# Patient Record
Sex: Female | Born: 1950 | Race: White | Hispanic: No | Marital: Single
Health system: Southern US, Community
[De-identification: ages and names within clinical notes are randomized; demographics above are authoritative.]

---

## 2010-05-16 ENCOUNTER — Emergency Department: Payer: Self-pay | Admitting: Emergency Medicine

## 2010-05-17 ENCOUNTER — Inpatient Hospital Stay: Payer: Self-pay | Admitting: Surgery

## 2010-06-29 ENCOUNTER — Ambulatory Visit: Payer: Self-pay | Admitting: Surgery

## 2010-07-01 ENCOUNTER — Ambulatory Visit: Payer: Self-pay | Admitting: Surgery

## 2010-07-05 LAB — PATHOLOGY REPORT

## 2012-01-03 ENCOUNTER — Ambulatory Visit: Payer: Self-pay | Admitting: Internal Medicine

## 2012-01-05 ENCOUNTER — Ambulatory Visit: Payer: Self-pay | Admitting: Internal Medicine

## 2012-03-05 ENCOUNTER — Ambulatory Visit: Payer: Self-pay | Admitting: Surgery

## 2012-03-06 ENCOUNTER — Ambulatory Visit: Payer: Self-pay | Admitting: Surgery

## 2012-03-10 LAB — BODY FLUID CULTURE

## 2012-03-28 ENCOUNTER — Ambulatory Visit: Payer: Self-pay | Admitting: Surgery

## 2012-03-28 LAB — CREATININE, SERUM
Creatinine: 1.33 mg/dL — ABNORMAL HIGH (ref 0.60–1.30)
EGFR (African American): 51 — ABNORMAL LOW

## 2012-04-15 ENCOUNTER — Ambulatory Visit: Payer: Self-pay | Admitting: Surgery

## 2012-04-15 LAB — CREATININE, SERUM
Creatinine: 1.32 mg/dL — ABNORMAL HIGH (ref 0.60–1.30)
EGFR (Non-African Amer.): 44 — ABNORMAL LOW

## 2012-05-06 ENCOUNTER — Ambulatory Visit: Payer: Self-pay | Admitting: Surgery

## 2012-05-06 LAB — CBC WITH DIFFERENTIAL/PLATELET
Basophil %: 0.5 %
Eosinophil #: 0.1 10*3/uL (ref 0.0–0.7)
HCT: 40.7 % (ref 35.0–47.0)
HGB: 12.6 g/dL (ref 12.0–16.0)
Lymphocyte #: 1.3 10*3/uL (ref 1.0–3.6)
Lymphocyte %: 12.9 %
MCH: 28.3 pg (ref 26.0–34.0)
MCHC: 31 g/dL — ABNORMAL LOW (ref 32.0–36.0)
MCV: 91 fL (ref 80–100)
Monocyte %: 6.5 %
Neutrophil #: 7.9 10*3/uL — ABNORMAL HIGH (ref 1.4–6.5)
RBC: 4.46 10*6/uL (ref 3.80–5.20)
RDW: 14.5 % (ref 11.5–14.5)
WBC: 10 10*3/uL (ref 3.6–11.0)

## 2012-05-06 LAB — BASIC METABOLIC PANEL
BUN: 24 mg/dL — ABNORMAL HIGH (ref 7–18)
Calcium, Total: 9.6 mg/dL (ref 8.5–10.1)
Chloride: 105 mmol/L (ref 98–107)
Co2: 27 mmol/L (ref 21–32)
EGFR (Non-African Amer.): 40 — ABNORMAL LOW
Potassium: 3.6 mmol/L (ref 3.5–5.1)
Sodium: 137 mmol/L (ref 136–145)

## 2012-05-06 LAB — HEPATIC FUNCTION PANEL A (ARMC): Bilirubin, Direct: <0.1 mg/dL (ref 0.00–0.20)

## 2012-05-06 LAB — PROTIME-INR: Prothrombin Time: 11.9 secs (ref 11.5–14.7)

## 2012-05-06 LAB — APTT: Activated PTT: 25.8 secs (ref 23.6–35.9)

## 2012-05-15 ENCOUNTER — Inpatient Hospital Stay: Payer: Self-pay | Admitting: Surgery

## 2012-05-15 LAB — CBC WITH DIFFERENTIAL/PLATELET
Basophil #: 0 10*3/uL (ref 0.0–0.1)
Eosinophil #: 0 10*3/uL (ref 0.0–0.7)
Eosinophil %: 0 %
HCT: 34.3 % — ABNORMAL LOW (ref 35.0–47.0)
HGB: 11 g/dL — ABNORMAL LOW (ref 12.0–16.0)
Lymphocyte %: 3.3 %
MCH: 29.6 pg (ref 26.0–34.0)
Monocyte #: 0.9 x10 3/mm (ref 0.2–0.9)
RBC: 3.73 10*6/uL — ABNORMAL LOW (ref 3.80–5.20)
RDW: 14.2 % (ref 11.5–14.5)
WBC: 23 10*3/uL — ABNORMAL HIGH (ref 3.6–11.0)

## 2012-05-16 LAB — CBC WITH DIFFERENTIAL/PLATELET
Basophil #: 0 10*3/uL (ref 0.0–0.1)
HCT: 32.5 % — ABNORMAL LOW (ref 35.0–47.0)
Lymphocyte %: 3.6 %
MCH: 30.1 pg (ref 26.0–34.0)
Monocyte %: 2.8 %
Neutrophil %: 93.6 %
Platelet: 229 10*3/uL (ref 150–440)
RDW: 14.2 % (ref 11.5–14.5)
WBC: 16.5 10*3/uL — ABNORMAL HIGH (ref 3.6–11.0)

## 2012-05-16 LAB — COMPREHENSIVE METABOLIC PANEL
Anion Gap: 9 (ref 7–16)
BUN: 19 mg/dL — ABNORMAL HIGH (ref 7–18)
Bilirubin,Total: 0.3 mg/dL (ref 0.2–1.0)
Calcium, Total: 7.7 mg/dL — ABNORMAL LOW (ref 8.5–10.1)
Co2: 22 mmol/L (ref 21–32)
EGFR (African American): 42 — ABNORMAL LOW
EGFR (Non-African Amer.): 36 — ABNORMAL LOW
Glucose: 161 mg/dL — ABNORMAL HIGH (ref 65–99)
SGPT (ALT): 24 U/L (ref 12–78)
Sodium: 142 mmol/L (ref 136–145)
Total Protein: 6.5 g/dL (ref 6.4–8.2)

## 2012-05-17 LAB — CBC WITH DIFFERENTIAL/PLATELET
Basophil %: 0 %
Eosinophil #: 0 10*3/uL (ref 0.0–0.7)
Eosinophil %: 0 %
HGB: 9.6 g/dL — ABNORMAL LOW (ref 12.0–16.0)
Lymphocyte #: 0.9 10*3/uL — ABNORMAL LOW (ref 1.0–3.6)
Lymphocyte %: 5 %
MCH: 30.4 pg (ref 26.0–34.0)
MCHC: 32.6 g/dL (ref 32.0–36.0)
Monocyte #: 1.2 x10 3/mm — ABNORMAL HIGH (ref 0.2–0.9)
Neutrophil %: 88.3 %
Platelet: 208 10*3/uL (ref 150–440)
RBC: 3.17 10*6/uL — ABNORMAL LOW (ref 3.80–5.20)
RDW: 14.1 % (ref 11.5–14.5)

## 2012-05-17 LAB — BASIC METABOLIC PANEL
Anion Gap: 5 — ABNORMAL LOW (ref 7–16)
BUN: 18 mg/dL (ref 7–18)
Chloride: 113 mmol/L — ABNORMAL HIGH (ref 98–107)
Co2: 25 mmol/L (ref 21–32)
Creatinine: 1.23 mg/dL (ref 0.60–1.30)
EGFR (African American): 56 — ABNORMAL LOW
EGFR (Non-African Amer.): 48 — ABNORMAL LOW
Glucose: 131 mg/dL — ABNORMAL HIGH (ref 65–99)
Osmolality: 289 (ref 275–301)
Sodium: 143 mmol/L (ref 136–145)

## 2012-05-21 LAB — PATHOLOGY REPORT

## 2013-01-06 ENCOUNTER — Ambulatory Visit: Payer: Self-pay | Admitting: Internal Medicine

## 2014-01-07 ENCOUNTER — Ambulatory Visit: Payer: Self-pay | Admitting: Internal Medicine

## 2014-03-29 IMAGING — CT CT PELVIS W/O CM
1 series · 14 of 32 positions shown, 18 images · non-contrast
Comparison: none

REASON FOR EXAM: Stat Call back [id number] Subcutaneous R hip Abscess
COMMENTS:

PROCEDURE:     KCT - KCT PELVIS STANDARD WO  - March 05, 2012  [DATE]
RESULT:     Comparison: None.
TECHNIQUE: Multiple axial images were obtained of the pelvis from the mid
lumbar spine through the pubic symphysis, without oral or intravenous
contrast.

[Series 7: pelvis wo · axial · 0.94mm/px · z∈[-464,-154]mm · 14 of 71 slices shown, 18 images]
[im 5/71  soft-tissue]
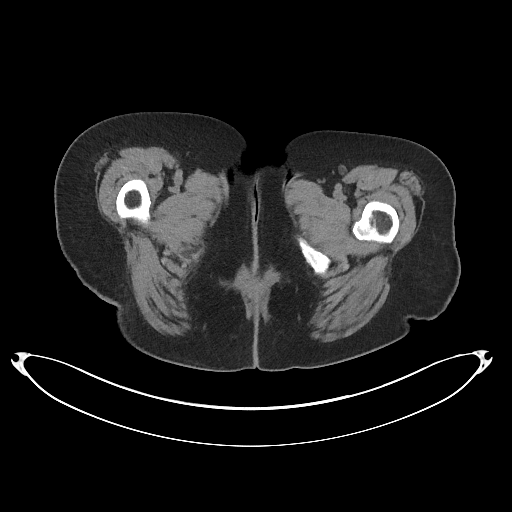
[im 5/71  bone]
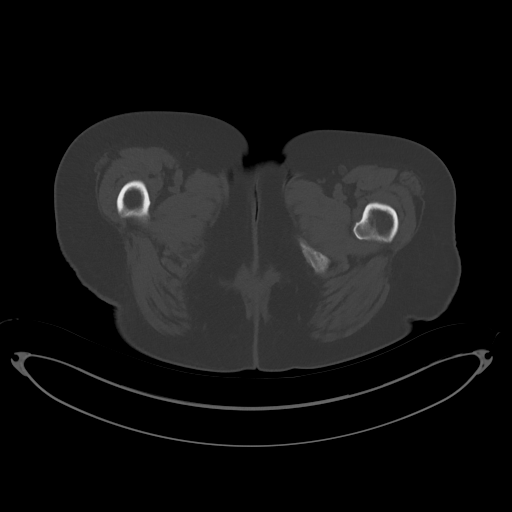
[im 10/71  soft-tissue]
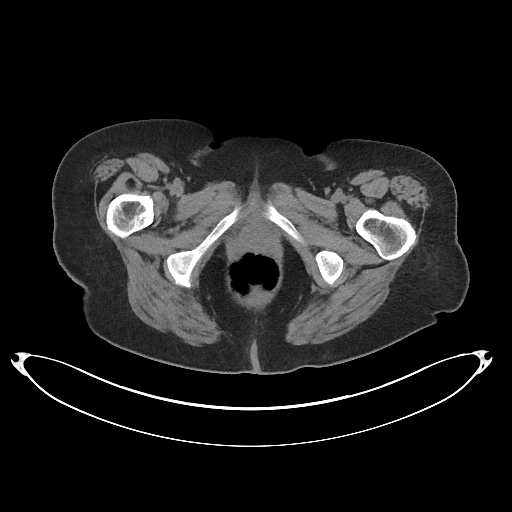
[im 16/71  soft-tissue]
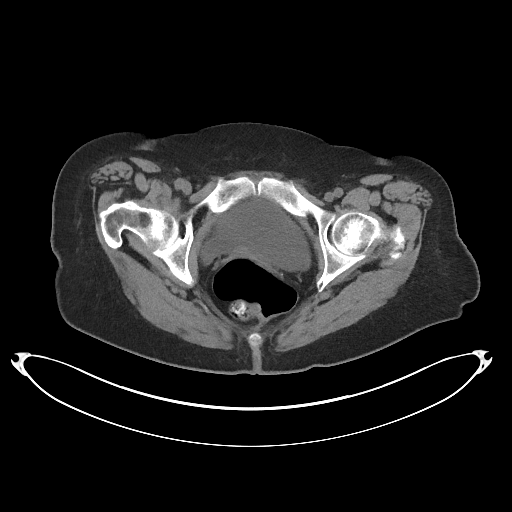
[im 21/71  soft-tissue]
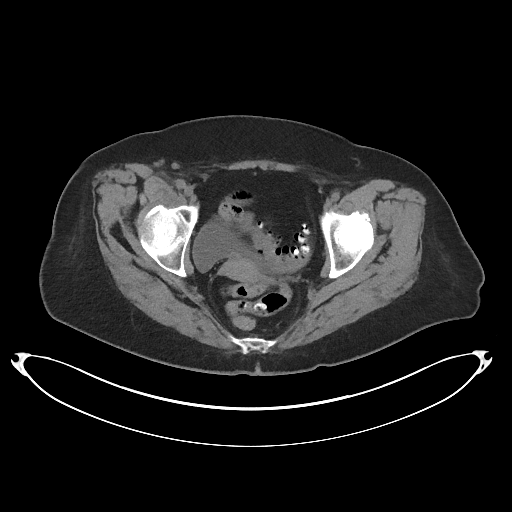
[im 28/71  soft-tissue]
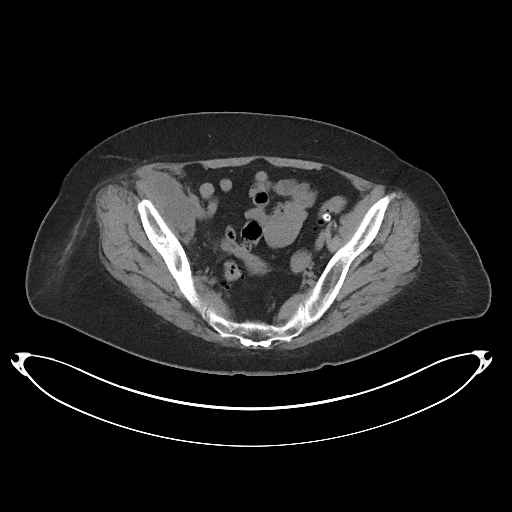
[im 32/71  soft-tissue]
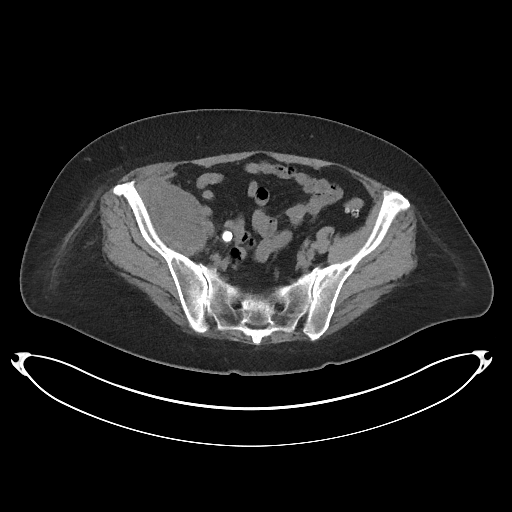
[im 39/71  soft-tissue]
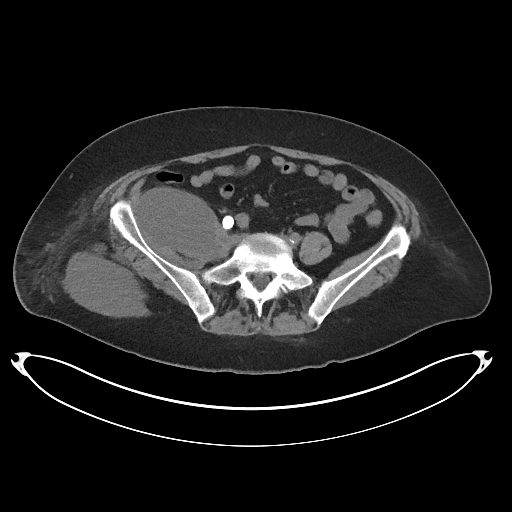
[im 43/71  soft-tissue]
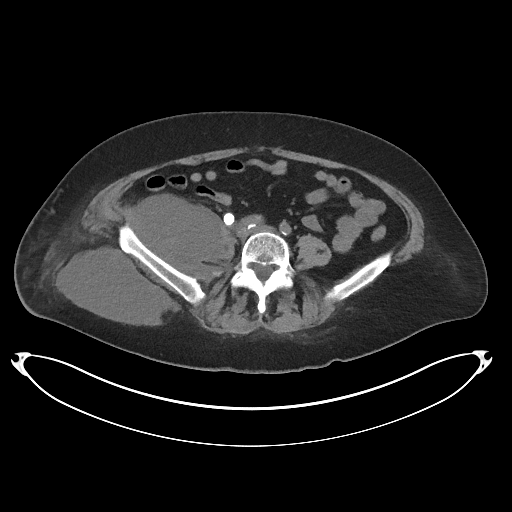
[im 50/71  soft-tissue]
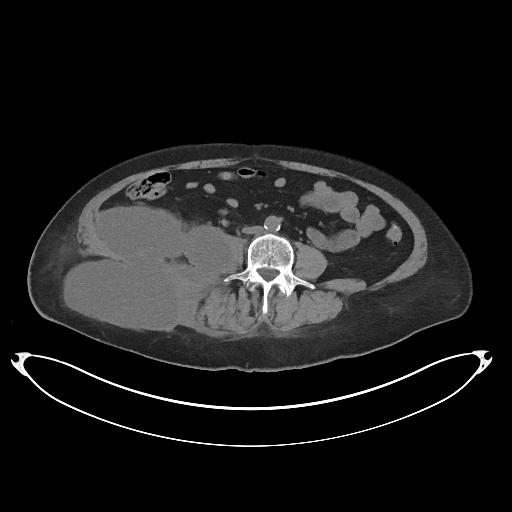
[im 50/71  bone]
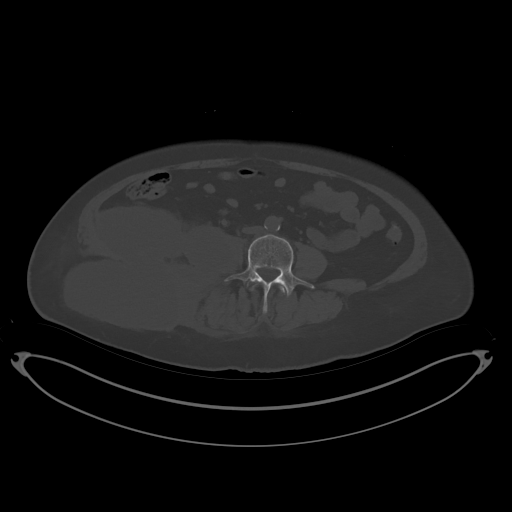
[im 55/71  soft-tissue]
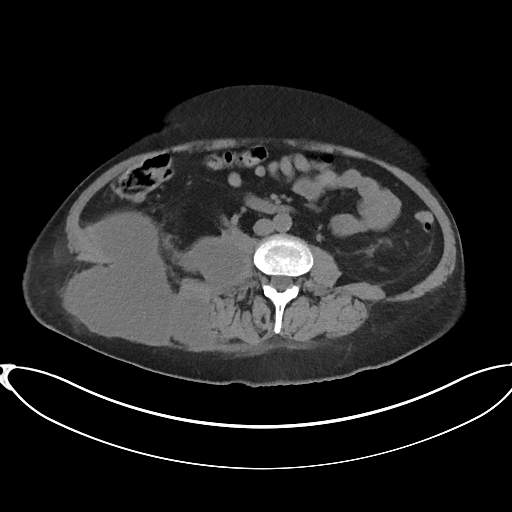
[im 61/71  soft-tissue]
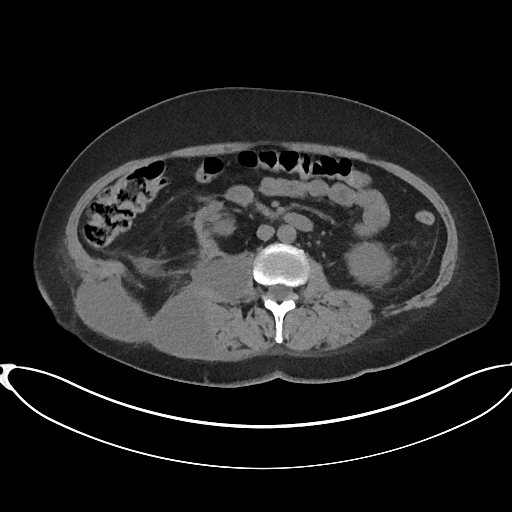
[im 61/71  lung]
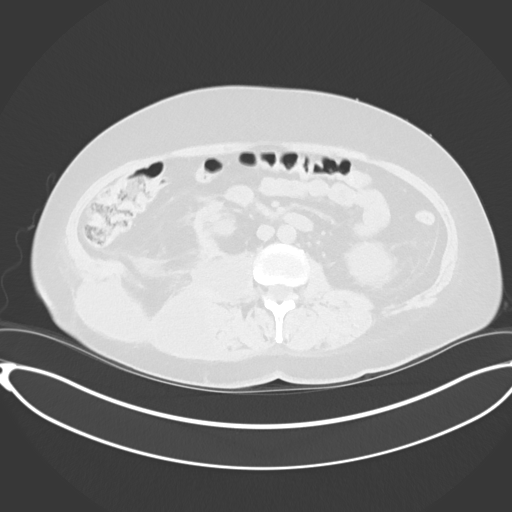
[im 64/71  lung]
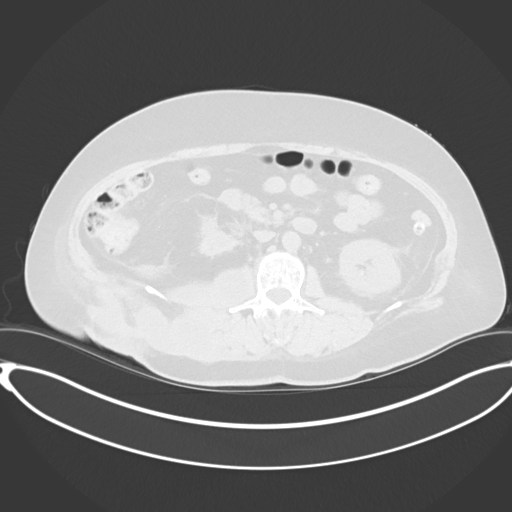
[im 66/71  soft-tissue]
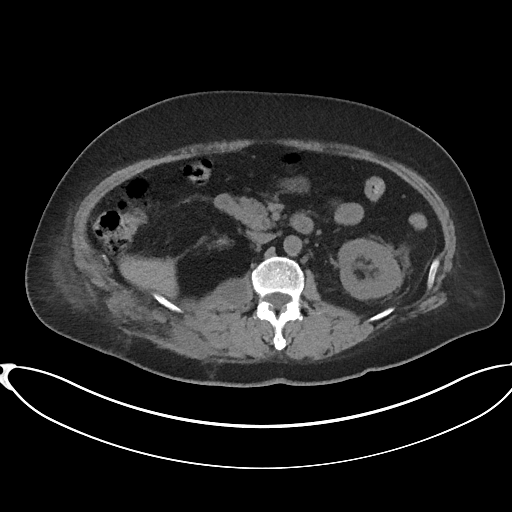
[im 66/71  lung]
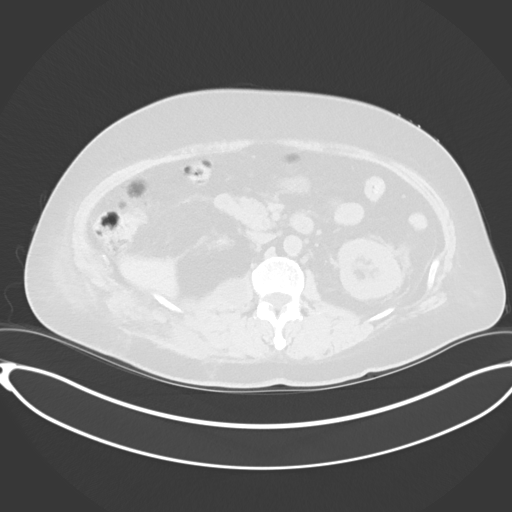
[im 68/71  lung]
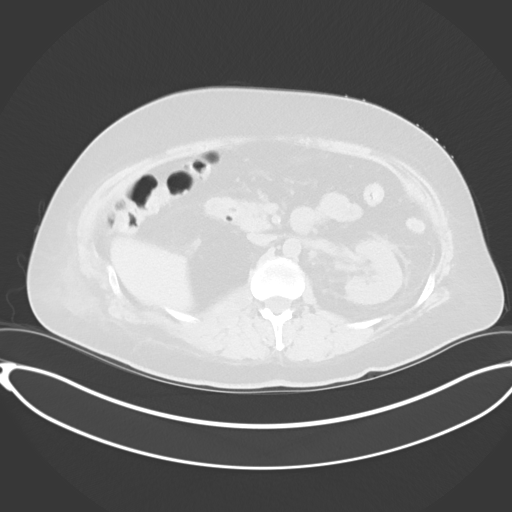

[14 of 32 positions shown; findings below may reference images not displayed]

FINDINGS: There is a large lobulated low density structure in the right side of the
pelvis which is seen extending from the posterior subcutaneous tissues into
the posterior aspect of the abdomen and pelvis as it is draped over the
iliac crest. The Hounsfield units measure 19. This could represent
complicated fluid. A solid mass cannot be excluded.  The collection extends
and involves the right iliopsoas muscle as well as the right erector spinae
musculature. It involves the right psoas muscle from the level of the lumbar
spine to the hip. This multilobulated collection measured approximately
x 11.2 cm. There is mild adjacent stranding, which is nonspecific. There is
a small of soft tissue density which extends from the atrophied right kidney
to the fluid collection in the right psoas muscle.

The right kidney is relatively atrophic. There is a large calculus filling
the right midureter which measures 11 mm and diameter and extends
approximately 8 cm in craniocaudal dimension. There is diverticulosis of the
sigmoid colon. The visualized small and large bowel are normal in caliber.
There is a 2 mm calculus in the left kidney.

No aggressive lytic or sclerotic osseous lesions are identified.
IMPRESSION: 1. Very large low density lobulated structure involving the subcutaneous fat
of the posterior pelvis as well as extending within the abdomen and pelvis
involving the psoas muscle as well as the erector spinae musculature of the
spine on the right. This may represent a complicated fluid collection such
as an abscess or proteinaceous collection. A solid mass cannot excluded by
this noncontrast study based on its density. There is a small area of soft
tissue density which extends from the involved psoas muscle to the atrophied
right kidney, and if this is an abscess, it may be renal in origin.
2. There is a large calculus filling the right ureter, with atrophy of the
right kidney.

This was called to Sandip Tiger at Dr. [REDACTED] at 4838 hours
03/12/2012.

## 2015-01-05 NOTE — Op Note (Signed)
PATIENT NAME:  Sabrina Boone, Sabrina Boone MR#:  [id number] DATE OF BIRTH:  [birth year]  DATE OF PROCEDURE:  05/15/2012  OPERATION PERFORMED: Hand-assisted laparoscopic radical right nephroureterectomy.   PREOPERATIVE DIAGNOSES: Recurrent Proteus psoas abscesses, chronic obstructed nonfunctioning right kidney, from huge distal ureteral stones.   SURGEON: Claude MangesWilliam F. Danyale Ridinger, MD  FIRST ASSISTANT: Dr. Ida Roguehristopher Lundquist  ANESTHESIA: General.   PROCEDURE IN DETAIL: The patient was placed supine on the Operating Room table and prepped and draped in the usual sterile fashion. A four-inch periumbilical midline incision was made and carried down through subcutaneous tissue and the linea alba and the peritoneum was entered carefully. A gel port was placed and a 15 mmHg CO2 pneumoperitoneum was created and a 12 mm trocar was placed in the right lower quadrant and a 5 mm trocar was placed in the right upper quadrant and subsequently during the procedure another 12 mm trocar was placed in the right upper quadrant near the gel port. Adhesions from the visceral surface of the gallbladder were taken down with the Harmonic scalpel and the adhesions to the visceral surface of the right lobe of the liver were taken down with the Harmonic scalpel and the right triangular ligament of the liver was partially divided and then the hepatic flexure of the colon was mobilized off of Gerota's fascia such that the entire right colon as well as the distal portion of the base of the mesentery was completely mobilized off of Gerota's fascia. This included the appendix. The patient was noted to have a very long calcified, very thick (2 cm diameter) distal ureter from the beginning of the true pelvis all the way the bladder. There was a small portion of distal ureter prior to the bladder that was soft. The proximal portion of this which was very densely scarred down was tightly adherent to the tissue surrounding the common iliac artery. This  therefore required ligation and division of the ovarian vessels fairly close to the ovary which was done with the Harmonic scalpel and then the ovary and fallopian tube were dissected off of the ureter all the way down to the area near where the fallopian tube joined the uterus. Tedious careful dissection ultimately freed this ureter off of the iliac vessels distally such that it was completely encircled and ultimately the distal ureter was divided with the echelon stapling device caring a gold load. An additional large Hemoclip or two were placed on a periurethral vessel following the division of the distal ureter. The radical nephrectomy was undertaken by staying outside of Gerota's fascia and dividing the very densely adherent scar tissue between the dorsal aspect of Gerota's fascia and the psoas muscle such that the kidney could be mobilized. A small Kocher maneuver was performed anteriorly and Gerota's fascia was entered just at the top of the kidney in order to maintain the right adrenal gland intact in the patient. As the inferior vena cava was exposed down the right lateral sidewall of the inferior vena cava a small vein was encountered and this was controlled with hemoclips. Initially, I felt that this was the ovarian vein but after the kidney had been removed I realize that this was the right renal vein. The entire kidney was mobilized onto what I thought was the renal pedicle and I had also circumferentially mobilized the proximal ureter and perirenal fat and then divided what I thought was the renal pedicle with the echelon stapling device utilizing a white vascular load. This then left the entire kidney and  ureter stuck to the common iliac artery and tedious dissection here with the Harmonic scalpel as well as the laparoscopic scissors allowed this ureter to be freed from the iliac artery. The specimen was removed and the entire operative field was irrigated with copious amounts of normal saline and  this was all suctioned clear. A lap sponge was then introduced to clean up small bleeding areas in the retroperitoneum where the psoas muscle was and along the inferior vena cava and then I realized that the previously placed hemoclip had actually been placed on a diminutive renal vein. I found the renal artery just dorsal and slightly inferior to that vein and although that artery was not bleeding it had been divided with the Harmonic scalpel and therefore I placed 3 or 4 additional large hemoclips on it. After hemostasis was assured the duodenum was replaced in its anatomic position as was the right colon covering the majority of the raw surface in the retroperitoneum and on psoas muscle and the right fallopian tube and ovary were placed right over top of where the ureter had been divided close to where it was going down towards the bladder. The two 12 mm ports were removed and the laparoscopic puncture closure device was used to close the fascia in both of these sites with interrupted 0 Vicryl and then the peritoneum was desufflated and decannulated and the linea alba was closed with a running #1 PDS suture. All skin incisions were closed with skin stapling devices and sterile dressings were applied. The patient tolerated the procedure well. There were no complications.  ____________________________ Claude Manges, MD wfm:cms D: 05/15/2012 13:25:20 ET T: 05/15/2012 14:07:37 ET JOB#: [id number]  cc: Claude Manges, MD, <Dictator>  Claude Manges MD ELECTRONICALLY SIGNED 05/19/2012 11:18

## 2015-01-05 NOTE — Discharge Summary (Signed)
PATIENT NAME:  Sabrina ReeseROBBINS, [name] MR#:  [id number] DATE OF BIRTH:  [birth year]  DATE OF ADMISSION:  05/15/2012 DATE OF DISCHARGE:  05/17/2012  PRINCIPLE DIAGNOSES:  1. Recurrent right psoas abscesses. 2. Atrophic right kidney. 3. Severe right ureteral stone disease.   OTHER DIAGNOSES:  1. Dyslipidemia. 2. Hypertension.   PRINCIPLE PROCEDURE PERFORMED DURING THIS ADMISSION: Hand-assisted laparoscopic radical right nephroureterectomy.   HOSPITAL COURSE: Ms. Sherral HammersRobbins was admitted to the hospital following the above-mentioned procedure for the above-mentioned diagnosis. By postoperative day two, she had a temperature maximum of 99.6. She felt great. She was eating well. She was not using her PCA. Her white blood cell count was down to 18, her hematocrit was 30, her creatinine was stable at 1.2, her incisions were clean and dry, and she was discharged home. She was asked to make an appointment [place] 05[place]24/2012, and to call in the interim for fever. She was to resume her prehospital medications including her ciprofloxacin.  ____________________________ Claude MangesWilliam F. Maryfrances Portugal, MD wfm:slb D: 05/17/2012 08:40:57 ET T: 05/17/2012 13:58:04 ET JOB#: [id number]  cc: Claude MangesWilliam F. Talina Pleitez, MD, <Dictator> Claude MangesWILLIAM F Alioune Hodgkin MD ELECTRONICALLY SIGNED 05/19/2012 11:18

## 2015-01-11 ENCOUNTER — Ambulatory Visit: Admit: 2015-01-11 | Disposition: A | Discharge status: Home / Self Care | Payer: Self-pay | Admitting: Internal Medicine

## 2015-05-17 ENCOUNTER — Telehealth: Payer: Self-pay | Admitting: *Deleted

## 2015-05-18 NOTE — Telephone Encounter (Signed)
Entered in error

## 2015-12-13 ENCOUNTER — Other Ambulatory Visit: Payer: Self-pay | Admitting: Internal Medicine

## 2015-12-13 DIAGNOSIS — Z1231 Encounter for screening mammogram for malignant neoplasm of breast: Secondary | ICD-10-CM

## 2016-01-12 ENCOUNTER — Ambulatory Visit
Admission: RE | Admit: 2016-01-12 | Discharge: 2016-01-12 | Disposition: A | Discharge status: Home / Self Care | Payer: BLUE CROSS/BLUE SHIELD | Source: Ambulatory Visit | Attending: Internal Medicine | Admitting: Internal Medicine

## 2016-01-12 DIAGNOSIS — Z1231 Encounter for screening mammogram for malignant neoplasm of breast: Secondary | ICD-10-CM | POA: Diagnosis not present

## 2016-12-04 ENCOUNTER — Other Ambulatory Visit: Payer: Self-pay | Admitting: Internal Medicine

## 2016-12-04 DIAGNOSIS — Z1231 Encounter for screening mammogram for malignant neoplasm of breast: Secondary | ICD-10-CM

## 2017-01-15 ENCOUNTER — Ambulatory Visit
Admission: RE | Admit: 2017-01-15 | Discharge: 2017-01-15 | Disposition: A | Discharge status: Home / Self Care | Payer: BLUE CROSS/BLUE SHIELD | Source: Ambulatory Visit | Attending: Internal Medicine | Admitting: Internal Medicine

## 2017-01-15 DIAGNOSIS — Z1231 Encounter for screening mammogram for malignant neoplasm of breast: Secondary | ICD-10-CM | POA: Diagnosis present

## 2017-12-07 ENCOUNTER — Other Ambulatory Visit: Payer: Self-pay | Admitting: Internal Medicine

## 2017-12-07 DIAGNOSIS — Z1231 Encounter for screening mammogram for malignant neoplasm of breast: Secondary | ICD-10-CM

## 2018-01-16 ENCOUNTER — Ambulatory Visit
Admission: RE | Admit: 2018-01-16 | Discharge: 2018-01-16 | Disposition: A | Discharge status: Home / Self Care | Payer: BLUE CROSS/BLUE SHIELD | Source: Ambulatory Visit | Attending: Internal Medicine | Admitting: Internal Medicine

## 2018-01-16 DIAGNOSIS — Z1231 Encounter for screening mammogram for malignant neoplasm of breast: Secondary | ICD-10-CM | POA: Diagnosis present

## 2018-12-06 ENCOUNTER — Other Ambulatory Visit: Payer: Self-pay | Admitting: Internal Medicine

## 2018-12-06 DIAGNOSIS — Z1231 Encounter for screening mammogram for malignant neoplasm of breast: Secondary | ICD-10-CM

## 2019-03-20 ENCOUNTER — Ambulatory Visit
Admission: RE | Admit: 2019-03-20 | Discharge: 2019-03-20 | Disposition: A | Discharge status: Home / Self Care | Payer: Medicare Other | Source: Ambulatory Visit | Attending: Internal Medicine | Admitting: Internal Medicine

## 2019-03-20 ENCOUNTER — Other Ambulatory Visit: Payer: Self-pay

## 2019-03-20 DIAGNOSIS — Z1231 Encounter for screening mammogram for malignant neoplasm of breast: Secondary | ICD-10-CM

## 2020-02-04 ENCOUNTER — Other Ambulatory Visit: Payer: Self-pay | Admitting: Internal Medicine

## 2020-02-04 DIAGNOSIS — Z1231 Encounter for screening mammogram for malignant neoplasm of breast: Secondary | ICD-10-CM

## 2020-03-24 ENCOUNTER — Ambulatory Visit
Admission: RE | Admit: 2020-03-24 | Discharge: 2020-03-24 | Disposition: A | Discharge status: Home / Self Care | Payer: Medicare Other | Source: Ambulatory Visit | Attending: Internal Medicine | Admitting: Internal Medicine

## 2020-03-24 DIAGNOSIS — Z1231 Encounter for screening mammogram for malignant neoplasm of breast: Secondary | ICD-10-CM | POA: Diagnosis not present

## 2021-02-04 ENCOUNTER — Other Ambulatory Visit: Payer: Self-pay | Admitting: Internal Medicine

## 2021-02-04 DIAGNOSIS — Z1231 Encounter for screening mammogram for malignant neoplasm of breast: Secondary | ICD-10-CM

## 2021-03-25 ENCOUNTER — Other Ambulatory Visit: Payer: Self-pay

## 2021-03-25 ENCOUNTER — Ambulatory Visit
Admission: RE | Admit: 2021-03-25 | Discharge: 2021-03-25 | Disposition: A | Discharge status: Home / Self Care | Payer: Medicare Other | Source: Ambulatory Visit | Attending: Internal Medicine | Admitting: Internal Medicine

## 2021-03-25 DIAGNOSIS — Z1231 Encounter for screening mammogram for malignant neoplasm of breast: Secondary | ICD-10-CM | POA: Insufficient documentation

## 2022-01-23 ENCOUNTER — Other Ambulatory Visit: Payer: Self-pay | Admitting: Internal Medicine

## 2022-01-23 DIAGNOSIS — Z1231 Encounter for screening mammogram for malignant neoplasm of breast: Secondary | ICD-10-CM

## 2022-03-27 ENCOUNTER — Ambulatory Visit
Admission: RE | Admit: 2022-03-27 | Discharge: 2022-03-27 | Disposition: A | Discharge status: Home / Self Care | Payer: Medicare Other | Source: Ambulatory Visit | Attending: Internal Medicine | Admitting: Internal Medicine

## 2022-03-27 DIAGNOSIS — Z1231 Encounter for screening mammogram for malignant neoplasm of breast: Secondary | ICD-10-CM | POA: Insufficient documentation

## 2023-01-18 ENCOUNTER — Other Ambulatory Visit: Payer: Self-pay | Admitting: Internal Medicine

## 2023-01-18 DIAGNOSIS — Z1231 Encounter for screening mammogram for malignant neoplasm of breast: Secondary | ICD-10-CM

## 2023-03-30 ENCOUNTER — Ambulatory Visit
Admission: RE | Admit: 2023-03-30 | Discharge: 2023-03-30 | Disposition: A | Discharge status: Home / Self Care | Payer: Medicare Other | Source: Ambulatory Visit | Attending: Internal Medicine | Admitting: Internal Medicine

## 2023-03-30 DIAGNOSIS — Z1231 Encounter for screening mammogram for malignant neoplasm of breast: Secondary | ICD-10-CM | POA: Diagnosis present

## 2024-01-21 ENCOUNTER — Other Ambulatory Visit: Payer: Self-pay | Admitting: Internal Medicine

## 2024-01-21 DIAGNOSIS — Z1231 Encounter for screening mammogram for malignant neoplasm of breast: Secondary | ICD-10-CM

## 2024-03-31 ENCOUNTER — Ambulatory Visit
Admission: RE | Admit: 2024-03-31 | Discharge: 2024-03-31 | Disposition: A | Discharge status: Home / Self Care | Source: Ambulatory Visit | Attending: Internal Medicine | Admitting: Internal Medicine

## 2024-03-31 DIAGNOSIS — Z1231 Encounter for screening mammogram for malignant neoplasm of breast: Secondary | ICD-10-CM | POA: Diagnosis present
